# Patient Record
Sex: Female | Born: 1965 | Race: White | Hispanic: No | Marital: Married | Smoking: Former smoker
Health system: Southern US, Community
[De-identification: ages and names within clinical notes are randomized; demographics above are authoritative.]

## PROBLEM LIST (undated history)

## (undated) DIAGNOSIS — R896 Abnormal cytological findings in specimens from other organs, systems and tissues: Secondary | ICD-10-CM

## (undated) DIAGNOSIS — B977 Papillomavirus as the cause of diseases classified elsewhere: Secondary | ICD-10-CM

## (undated) DIAGNOSIS — G43909 Migraine, unspecified, not intractable, without status migrainosus: Secondary | ICD-10-CM

## (undated) DIAGNOSIS — IMO0001 Reserved for inherently not codable concepts without codable children: Secondary | ICD-10-CM

## (undated) HISTORY — DX: Reserved for inherently not codable concepts without codable children: IMO0001

## (undated) HISTORY — DX: Migraine, unspecified, not intractable, without status migrainosus: G43.909

## (undated) HISTORY — DX: Abnormal cytological findings in specimens from other organs, systems and tissues: R89.6

## (undated) HISTORY — DX: Papillomavirus as the cause of diseases classified elsewhere: B97.7

---

## 2001-08-01 ENCOUNTER — Encounter: Payer: Self-pay | Admitting: Obstetrics and Gynecology

## 2001-08-01 ENCOUNTER — Encounter: Admission: RE | Admit: 2001-08-01 | Discharge: 2001-08-01 | Payer: Self-pay | Admitting: Obstetrics and Gynecology

## 2002-10-20 ENCOUNTER — Other Ambulatory Visit: Admission: RE | Admit: 2002-10-20 | Discharge: 2002-10-20 | Payer: Self-pay | Admitting: Obstetrics and Gynecology

## 2003-09-17 ENCOUNTER — Other Ambulatory Visit: Admission: RE | Admit: 2003-09-17 | Discharge: 2003-09-17 | Payer: Self-pay | Admitting: Obstetrics and Gynecology

## 2004-07-14 ENCOUNTER — Other Ambulatory Visit: Admission: RE | Admit: 2004-07-14 | Discharge: 2004-07-14 | Payer: Self-pay | Admitting: Obstetrics and Gynecology

## 2005-07-10 ENCOUNTER — Other Ambulatory Visit: Admission: RE | Admit: 2005-07-10 | Discharge: 2005-07-10 | Payer: Self-pay | Admitting: Obstetrics and Gynecology

## 2008-07-19 ENCOUNTER — Encounter: Admission: RE | Admit: 2008-07-19 | Discharge: 2008-08-29 | Payer: Self-pay | Admitting: Family Medicine

## 2011-06-15 ENCOUNTER — Emergency Department (HOSPITAL_COMMUNITY): Payer: 59

## 2011-06-15 ENCOUNTER — Encounter (HOSPITAL_COMMUNITY): Payer: Self-pay | Admitting: Emergency Medicine

## 2011-06-15 ENCOUNTER — Ambulatory Visit (HOSPITAL_COMMUNITY)
Admission: EM | Admit: 2011-06-15 | Discharge: 2011-06-16 | Disposition: A | Discharge status: Home / Self Care | Payer: 59 | Attending: Emergency Medicine | Admitting: Emergency Medicine

## 2011-06-15 DIAGNOSIS — K3580 Unspecified acute appendicitis: Secondary | ICD-10-CM | POA: Diagnosis present

## 2011-06-15 DIAGNOSIS — G43909 Migraine, unspecified, not intractable, without status migrainosus: Secondary | ICD-10-CM | POA: Diagnosis present

## 2011-06-15 DIAGNOSIS — Z79899 Other long term (current) drug therapy: Secondary | ICD-10-CM | POA: Insufficient documentation

## 2011-06-15 MED ORDER — SODIUM CHLORIDE 0.9 % IV SOLN
Freq: Once | INTRAVENOUS | Status: AC
  Administered 2011-06-16: via INTRAVENOUS

## 2011-06-15 MED ORDER — HYDROMORPHONE HCL PF 1 MG/ML IJ SOLN
1.0000 mg | Freq: Once | INTRAMUSCULAR | Status: AC
  Administered 2011-06-16: 1 mg via INTRAVENOUS
  Filled 2011-06-15: qty 1

## 2011-06-15 MED ORDER — ONDANSETRON HCL 4 MG/2ML IJ SOLN
4.0000 mg | Freq: Once | INTRAMUSCULAR | Status: AC
  Administered 2011-06-16: 4 mg via INTRAVENOUS
  Filled 2011-06-15: qty 2

## 2011-06-15 NOTE — ED Notes (Signed)
Pt alert, c/o right lower quad pain, onset this am, seen in [facility], instructed to f/u for U/S of gallbladder, provided pain relief, c/o nausea, and emesis, resp even unlabored, skin pwd

## 2011-06-16 ENCOUNTER — Encounter (HOSPITAL_COMMUNITY): Payer: Self-pay | Admitting: Anesthesiology

## 2011-06-16 ENCOUNTER — Encounter (HOSPITAL_COMMUNITY)
Admission: EM | Disposition: A | Discharge status: Home / Self Care | Payer: Self-pay | Source: Home / Self Care | Attending: Emergency Medicine

## 2011-06-16 ENCOUNTER — Emergency Department (HOSPITAL_COMMUNITY): Payer: 59 | Admitting: Anesthesiology

## 2011-06-16 ENCOUNTER — Encounter (HOSPITAL_COMMUNITY): Payer: Self-pay

## 2011-06-16 DIAGNOSIS — G43909 Migraine, unspecified, not intractable, without status migrainosus: Secondary | ICD-10-CM | POA: Diagnosis present

## 2011-06-16 DIAGNOSIS — K3580 Unspecified acute appendicitis: Secondary | ICD-10-CM | POA: Diagnosis present

## 2011-06-16 HISTORY — PX: LAPAROSCOPIC APPENDECTOMY: SHX408

## 2011-06-16 LAB — CBC
HCT: 41.6 % (ref 36.0–46.0)
Hemoglobin: 14.5 g/dL (ref 12.0–15.0)
MCH: 32.9 pg (ref 26.0–34.0)
MCHC: 34.9 g/dL (ref 30.0–36.0)
MCV: 94.3 fL (ref 78.0–100.0)
Platelets: 263 10*3/uL (ref 150–400)
RBC: 4.41 MIL/uL (ref 3.87–5.11)
RDW: 12.9 % (ref 11.5–15.5)
WBC: 17.6 10*3/uL — ABNORMAL HIGH (ref 4.0–10.5)

## 2011-06-16 LAB — COMPREHENSIVE METABOLIC PANEL
Albumin: 4.1 g/dL (ref 3.5–5.2)
BUN: 16 mg/dL (ref 6–23)
Calcium: 9.1 mg/dL (ref 8.4–10.5)
Creatinine, Ser: 0.98 mg/dL (ref 0.50–1.10)
Total Bilirubin: 0.3 mg/dL (ref 0.3–1.2)
Total Protein: 7.9 g/dL (ref 6.0–8.3)

## 2011-06-16 LAB — POCT PREGNANCY, URINE: Preg Test, Ur: NEGATIVE

## 2011-06-16 SURGERY — APPENDECTOMY, LAPAROSCOPIC
Anesthesia: General | Site: Abdomen | Wound class: Contaminated

## 2011-06-16 MED ORDER — ONDANSETRON HCL 4 MG/2ML IJ SOLN
INTRAMUSCULAR | Status: DC | PRN
  Administered 2011-06-16: 4 mg via INTRAVENOUS

## 2011-06-16 MED ORDER — FENTANYL CITRATE 0.05 MG/ML IJ SOLN: 25.0000 ug | INTRAMUSCULAR | Status: DC | PRN

## 2011-06-16 MED ORDER — HYDROMORPHONE HCL PF 1 MG/ML IJ SOLN
1.0000 mg | Freq: Once | INTRAMUSCULAR | Status: AC
  Administered 2011-06-16: 1 mg via INTRAVENOUS
  Filled 2011-06-16: qty 1

## 2011-06-16 MED ORDER — ACETAMINOPHEN 325 MG PO TABS: 650.0000 mg | ORAL_TABLET | Freq: Four times a day (QID) | ORAL | Status: AC | PRN

## 2011-06-16 MED ORDER — AMOXICILLIN-POT CLAVULANATE 875-125 MG PO TABS: 1.0000 | ORAL_TABLET | Freq: Two times a day (BID) | ORAL | Status: AC

## 2011-06-16 MED ORDER — GLYCOPYRROLATE 0.2 MG/ML IJ SOLN
INTRAMUSCULAR | Status: DC | PRN
  Administered 2011-06-16: .5 mg via INTRAVENOUS

## 2011-06-16 MED ORDER — CISATRACURIUM BESYLATE (PF) 10 MG/5ML IV SOLN
INTRAVENOUS | Status: DC | PRN
  Administered 2011-06-16: 6 mg via INTRAVENOUS

## 2011-06-16 MED ORDER — DEXAMETHASONE SODIUM PHOSPHATE 10 MG/ML IJ SOLN
INTRAMUSCULAR | Status: DC | PRN
  Administered 2011-06-16: 10 mg via INTRAVENOUS

## 2011-06-16 MED ORDER — OXYCODONE HCL 5 MG PO TABS: 5.0000 mg | ORAL_TABLET | ORAL | Status: DC | PRN

## 2011-06-16 MED ORDER — 0.9 % SODIUM CHLORIDE (POUR BTL) OPTIME
TOPICAL | Status: DC | PRN
  Administered 2011-06-16: 100 mL

## 2011-06-16 MED ORDER — BUPIVACAINE HCL (PF) 0.5 % IJ SOLN
INTRAMUSCULAR | Status: AC
  Filled 2011-06-16: qty 30

## 2011-06-16 MED ORDER — SODIUM CHLORIDE 0.9 % IV SOLN
INTRAVENOUS | Status: DC
  Administered 2011-06-16: 15:00:00 via INTRAVENOUS

## 2011-06-16 MED ORDER — PIPERACILLIN-TAZOBACTAM 3.375 G IVPB
3.3750 g | Freq: Three times a day (TID) | INTRAVENOUS | Status: DC
  Administered 2011-06-16: 3.375 g via INTRAVENOUS
  Filled 2011-06-16 (×2): qty 50

## 2011-06-16 MED ORDER — ACETAMINOPHEN 650 MG RE SUPP: 650.0000 mg | Freq: Four times a day (QID) | RECTAL | Status: DC | PRN

## 2011-06-16 MED ORDER — MIDAZOLAM HCL 5 MG/5ML IJ SOLN
INTRAMUSCULAR | Status: DC | PRN
  Administered 2011-06-16: 2 mg via INTRAVENOUS

## 2011-06-16 MED ORDER — IMIPRAMINE HCL 50 MG PO TABS
50.0000 mg | ORAL_TABLET | Freq: Every day | ORAL | Status: DC
  Filled 2011-06-16: qty 1

## 2011-06-16 MED ORDER — LACTATED RINGERS IR SOLN
Status: DC | PRN
  Administered 2011-06-16: 1000 mL

## 2011-06-16 MED ORDER — LACTATED RINGERS IV SOLN: INTRAVENOUS | Status: DC

## 2011-06-16 MED ORDER — NEOSTIGMINE METHYLSULFATE 1 MG/ML IJ SOLN
INTRAMUSCULAR | Status: DC | PRN
  Administered 2011-06-16: 4 mg via INTRAVENOUS

## 2011-06-16 MED ORDER — FENTANYL CITRATE 0.05 MG/ML IJ SOLN
INTRAMUSCULAR | Status: DC | PRN
  Administered 2011-06-16: 100 ug via INTRAVENOUS
  Administered 2011-06-16: 50 ug via INTRAVENOUS

## 2011-06-16 MED ORDER — MORPHINE SULFATE 2 MG/ML IJ SOLN: 2.0000 mg | INTRAMUSCULAR | Status: DC | PRN

## 2011-06-16 MED ORDER — ACETAMINOPHEN 325 MG PO TABS
650.0000 mg | ORAL_TABLET | Freq: Four times a day (QID) | ORAL | Status: DC | PRN
  Administered 2011-06-16: 650 mg via ORAL
  Filled 2011-06-16: qty 2

## 2011-06-16 MED ORDER — OXYCODONE-ACETAMINOPHEN 5-325 MG PO TABS: 1.0000 | ORAL_TABLET | ORAL | Status: AC | PRN

## 2011-06-16 MED ORDER — PROPOFOL 10 MG/ML IV EMUL
INTRAVENOUS | Status: DC | PRN
  Administered 2011-06-16: 160 mg via INTRAVENOUS

## 2011-06-16 MED ORDER — ONDANSETRON HCL 4 MG/2ML IJ SOLN: 4.0000 mg | Freq: Four times a day (QID) | INTRAMUSCULAR | Status: DC | PRN

## 2011-06-16 MED ORDER — SODIUM CHLORIDE 0.9 % IV SOLN
3.0000 g | Freq: Once | INTRAVENOUS | Status: AC
  Administered 2011-06-16: 3 g via INTRAVENOUS
  Filled 2011-06-16: qty 3

## 2011-06-16 MED ORDER — LIDOCAINE-EPINEPHRINE 1 %-1:100000 IJ SOLN
INTRAMUSCULAR | Status: AC
  Filled 2011-06-16: qty 1

## 2011-06-16 MED ORDER — LACTATED RINGERS IV SOLN
INTRAVENOUS | Status: DC | PRN
  Administered 2011-06-16 (×2): via INTRAVENOUS

## 2011-06-16 MED ORDER — BUPIVACAINE HCL (PF) 0.25 % IJ SOLN
INTRAMUSCULAR | Status: DC | PRN
  Administered 2011-06-16: 20 mL

## 2011-06-16 MED ORDER — TOPIRAMATE 100 MG PO TABS
200.0000 mg | ORAL_TABLET | Freq: Every day | ORAL | Status: DC
  Administered 2011-06-16: 200 mg via ORAL
  Filled 2011-06-16: qty 2

## 2011-06-16 MED ORDER — SUCCINYLCHOLINE CHLORIDE 20 MG/ML IJ SOLN
INTRAMUSCULAR | Status: DC | PRN
  Administered 2011-06-16: 100 mg via INTRAVENOUS

## 2011-06-16 MED ORDER — IOHEXOL 300 MG/ML  SOLN
100.0000 mL | Freq: Once | INTRAMUSCULAR | Status: AC | PRN
  Administered 2011-06-16: 100 mL via INTRAVENOUS

## 2011-06-16 SURGICAL SUPPLY — 44 items
ADH SKN CLS APL DERMABOND .7 (GAUZE/BANDAGES/DRESSINGS) ×1
BAG SPEC RTRVL LRG 6X4 10 (ENDOMECHANICALS) ×1
CABLE HIGH FREQUENCY MONO STRZ (ELECTRODE) ×2 IMPLANT
CANISTER SUCTION 2500CC (MISCELLANEOUS) ×2 IMPLANT
CHLORAPREP W/TINT 26ML (MISCELLANEOUS) ×2 IMPLANT
CLOTH BEACON ORANGE TIMEOUT ST (SAFETY) ×2 IMPLANT
CORD HIGH FREQUENCY UNIPOLAR (ELECTROSURGICAL) ×1 IMPLANT
COVER SURGICAL LIGHT HANDLE (MISCELLANEOUS) ×2 IMPLANT
CUTTER FLEX LINEAR 45M (STAPLE) ×2 IMPLANT
DECANTER SPIKE VIAL GLASS SM (MISCELLANEOUS) ×2 IMPLANT
DERMABOND ADVANCED (GAUZE/BANDAGES/DRESSINGS) ×1
DERMABOND ADVANCED .7 DNX12 (GAUZE/BANDAGES/DRESSINGS) ×1 IMPLANT
DRAPE LAPAROSCOPIC ABDOMINAL (DRAPES) ×2 IMPLANT
ELECT REM PT RETURN 9FT ADLT (ELECTROSURGICAL) ×2
ELECTRODE REM PT RTRN 9FT ADLT (ELECTROSURGICAL) ×1 IMPLANT
GLOVE BIO SURGEON STRL SZ7 (GLOVE) ×4 IMPLANT
GLOVE BIOGEL PI IND STRL 7.0 (GLOVE) ×1 IMPLANT
GLOVE BIOGEL PI IND STRL 7.5 (GLOVE) ×2 IMPLANT
GLOVE BIOGEL PI INDICATOR 7.0 (GLOVE)
GLOVE BIOGEL PI INDICATOR 7.5 (GLOVE) ×2
GOWN PREVENTION PLUS LG XLONG (DISPOSABLE) ×2 IMPLANT
GOWN PREVENTION PLUS XLARGE (GOWN DISPOSABLE) ×2 IMPLANT
GOWN STRL NON-REIN LRG LVL3 (GOWN DISPOSABLE) ×2 IMPLANT
GOWN STRL REIN XL XLG (GOWN DISPOSABLE) ×2 IMPLANT
IV LACTATED RINGERS 1000ML (IV SOLUTION) ×1 IMPLANT
KIT BASIN OR (CUSTOM PROCEDURE TRAY) ×2 IMPLANT
NS IRRIG 1000ML POUR BTL (IV SOLUTION) ×2 IMPLANT
PENCIL BUTTON HOLSTER BLD 10FT (ELECTRODE) IMPLANT
POUCH SPECIMEN RETRIEVAL 10MM (ENDOMECHANICALS) ×2 IMPLANT
RELOAD 45 VASCULAR/THIN (ENDOMECHANICALS) IMPLANT
RELOAD STAPLE 45 2.5 WHT GRN (ENDOMECHANICALS) IMPLANT
RELOAD STAPLE 45 3.5 BLU ETS (ENDOMECHANICALS) ×1 IMPLANT
RELOAD STAPLE TA45 3.5 REG BLU (ENDOMECHANICALS) ×2 IMPLANT
SCALPEL HARMONIC ACE (MISCELLANEOUS) ×2 IMPLANT
SET IRRIG TUBING LAPAROSCOPIC (IRRIGATION / IRRIGATOR) ×2 IMPLANT
SOLUTION ANTI FOG 6CC (MISCELLANEOUS) ×2 IMPLANT
SUT MNCRL AB 4-0 PS2 18 (SUTURE) ×2 IMPLANT
SUT VICRYL 0 ENDOLOOP (SUTURE) IMPLANT
TOWEL OR 17X26 10 PK STRL BLUE (TOWEL DISPOSABLE) ×2 IMPLANT
TRAY FOLEY CATH 14FRSI W/METER (CATHETERS) ×2 IMPLANT
TRAY LAP CHOLE (CUSTOM PROCEDURE TRAY) ×2 IMPLANT
TROCAR BLADELESS OPT 5 75 (ENDOMECHANICALS) ×4 IMPLANT
TROCAR XCEL BLUNT TIP 100MML (ENDOMECHANICALS) ×2 IMPLANT
TUBING INSUFFLATION 10FT LAP (TUBING) ×2 IMPLANT

## 2011-06-16 NOTE — H&P (Signed)
Laurie Cortez is an 46 y.o. female.   Chief Complaint: abdominal pain, referred by Dr. Eber Hong HPI:  74 yof who is otherwise healthy began having periumbilical abdominal pain yesterday at noon.  This has progressed and she went to urgent care last night.  It was thought it might be her gallbladder at that time and she was given pain meds and antinausea medications.  The pain then continued to worsen and moved to the RLQ.  There are no relieving factors, aggravated by movement and palpation.  She was also having some n/v.  History reviewed. No pertinent past medical history. PMH: migraines  History reviewed. No pertinent past surgical history.  No family history on file. Social History:  reports that she has never smoked. She does not have any smokeless tobacco history on file. She reports that she does not drink alcohol. Her drug history not on file.  Allergies: No Known Allergies  Meds: imipramine, topamax  Results for orders placed during the hospital encounter of 06/15/11 (from the past 48 hour(s))  CBC     Status: Abnormal   Collection Time   06/16/11 12:01 AM      Component Value Range Comment   WBC 17.6 (*) 4.0 - 10.5 (K/uL)    RBC 4.41  3.87 - 5.11 (MIL/uL)    Hemoglobin 14.5  12.0 - 15.0 (g/dL)    HCT 16.1  09.6 - 04.5 (%)    MCV 94.3  78.0 - 100.0 (fL)    MCH 32.9  26.0 - 34.0 (pg)    MCHC 34.9  30.0 - 36.0 (g/dL)    RDW 40.9  81.1 - 91.4 (%)    Platelets 263  150 - 400 (K/uL)   COMPREHENSIVE METABOLIC PANEL     Status: Abnormal   Collection Time   06/16/11 12:01 AM      Component Value Range Comment   Sodium 131 (*) 135 - 145 (mEq/L)    Potassium 4.1  3.5 - 5.1 (mEq/L)    Chloride 99  96 - 112 (mEq/L)    CO2 21  19 - 32 (mEq/L)    Glucose, Bld 125 (*) 70 - 99 (mg/dL)    BUN 16  6 - 23 (mg/dL)    Creatinine, Ser 7.82  0.50 - 1.10 (mg/dL)    Calcium 9.1  8.4 - 10.5 (mg/dL)    Total Protein 7.9  6.0 - 8.3 (g/dL)    Albumin 4.1  3.5 - 5.2 (g/dL)    AST 21  0 - 37  (U/L)    ALT 18  0 - 35 (U/L)    Alkaline Phosphatase 68  39 - 117 (U/L)    Total Bilirubin 0.3  0.3 - 1.2 (mg/dL)    GFR calc non Af Amer 68 (*) >90 (mL/min)    GFR calc Af Amer 79 (*) >90 (mL/min)   POCT PREGNANCY, URINE     Status: Normal   Collection Time   06/16/11  1:28 AM      Component Value Range Comment   Preg Test, Ur NEGATIVE  NEGATIVE     Ct Abdomen Pelvis W Contrast  06/16/2011  *RADIOLOGY REPORT*  Clinical Data: Right lower quadrant pain.  Nausea and vomiting. White cell count 17.6.  CT ABDOMEN AND PELVIS WITH CONTRAST  Technique:  Multidetector CT imaging of the abdomen and pelvis was performed following the standard protocol during bolus administration of intravenous contrast.  Contrast: OMNIPAQUE IOHEXOL 300 MG/ML  SOLN  Comparison: None.  Findings: Mild dependent changes in the lung bases.    Calcified granulomas in the liver and spleen.  Calcification in the hepatic hilum adjacent to the gallbladder may represent a gallstone or bile duct stone.  No gallbladder distension or wall thickening.  No biliary dilatation.  The pancreas, adrenal glands, abdominal aorta, and retroperitoneal lymph nodes are unremarkable.  Low attenuation lesion in the upper pole of the left kidney measures 1.8 cm and likely represents a cyst.  No solid mass or hydronephrosis in either kidney.  The stomach, small bowel, and colon are not abnormally distended.  No free air or free fluid in the abdomen.  Pelvis:  There is a large appendicolith at the base of the appendix measuring 9 mm diameter. Additional appendicolith at the appendiceal tip.  The appendix is prominently distended with maximal diameter of 1.7 cm.  There is periappendiceal stranding. Changes are consistent with acute appendicitis.  No abscess.  Small amount of free fluid in the pelvis.  The uterus and adnexal structures are not enlarged.  The bladder wall is not thickened.  No significant pelvic lymphadenopathy. Mild degenerative changes in  the lumbar spine.  IMPRESSION: Changes of acute appendicitis with appendicoliths and periappendiceal stranding.  No abscess.  Small amount of free pelvic fluid.  Calcification in the porta hepatis could represent gallstones or biliary stones versus calcified lymph node.  No bile duct dilatation.  Original Report Authenticated By: Marlon Pel, M.D.    ROS  Blood pressure 105/63, pulse 94, temperature 98 F (36.7 C), resp. rate 16, weight 145 lb (65.772 kg), last menstrual period 05/21/2011, SpO2 97.00%. Physical Exam  Vitals reviewed. Constitutional: She appears well-developed and well-nourished.  Eyes: No scleral icterus.  Cardiovascular: Normal rate, regular rhythm and normal heart sounds.   Respiratory: Effort normal and breath sounds normal. She has no wheezes. She has no rales.  GI: Soft. Normal appearance and bowel sounds are normal. There is tenderness in the right lower quadrant.     Assessment/Plan Acute appendicitis  We discussed pathophysiology of appendicitis.  We discussed no other real options at this time.  I recommended laparoscopic appendectomy.  Risks include but not limited to bleeding, infection, postoperative abscess requiring drainage, open incision, open wound, injury to surrounding structures.  She understands and we will proceed  [facility] 06/16/2011, 4:51 AM

## 2011-06-16 NOTE — Anesthesia Postprocedure Evaluation (Signed)
  Anesthesia Post-op Note  Patient: Laurie Cortez  Procedure(s) Performed: Procedure(s) (LRB): APPENDECTOMY LAPAROSCOPIC (N/A)  Patient Location: PACU  Anesthesia Type: General  Level of Consciousness: awake and alert   Airway and Oxygen Therapy: Patient Spontanous Breathing  Post-op Pain: mild  Post-op Assessment: Post-op Vital signs reviewed, Patient's Cardiovascular Status Stable, Respiratory Function Stable, Patent Airway and No signs of Nausea or vomiting  Post-op Vital Signs: stable  Complications: No apparent anesthesia complications

## 2011-06-16 NOTE — ED Notes (Signed)
46 year old female history of abdominal pain started yesterday and has been persistent and moving to the right lower quadrant, patient denies nausea vomiting but has loss of appetite, no fevers. No abdominal surgical history no other significant medical problems.  Presented initially to outpatient urgent care setting where she was given pain medication and referred to the emergency department should her pain worsen.  Physical exam:  Healthy-appearing female of stated age, no edema of the lower extremities, clear heart and lungs, abdomen which is soft but tender with guarding to the right lower quadrant. Non-peritoneal  Assessment:  Elevated white blood cell count, electrolytes overall normal, normal renal function, ongoing right lower quadrant tenderness which is a confirmed appendicitis on CT scan of the abdomen. Discussed with general surgeon Dr. Dwain Sarna who will see the patient this morning and likely operator the next few hours. Antibiotics requested, Unasyn ordered. Repeat pain medication given.  Medical screening examination/treatment/procedure(s) were conducted as a shared visit with non-physician practitioner(s) and myself.  I personally evaluated the patient during the encounter    Vida Roller, MD 06/16/11 [phone]

## 2011-06-16 NOTE — Op Note (Signed)
Preoperative diagnosis: Acute suppurative appendicitis Postoperative diagnosis: SAA Procedure: laparoscopic appendectomy Surgeon: Dr. Harden Mo Anesthesia: GETA Specimen:appendix to pathology EBL: minimal Complications: none Sponge and needle count correct times two at end of operation Dispo to recovery in stable condition  Indications: This is a 50 yof who has RLQ pain, elevated WBC, and a CT scan consistent with appendicitis.  We discussed a laparoscopic appendectomy with risks and benefits.  Procedure: After informed consent was obtained, the patient was taken to the operating room.  She was administered unasyn.  SCDs were placed on her lower extremities prior to beginning.  She was then placed under general anesthesia without complication.  Her abdomen was then prepped and draped in the standard sterile surgical fashion.  A Foley catheter was placed.  A surgical timeout was then performed.  I infiltrated quarter percent Marcaine below the umbilicus.  I then made a vertical incision below the umbilicus.  I grasped the fascia and entered it sharply.  The peritoneum was entered bluntly.  I then placed a 0 vicryl pursestring suture through the fascia.  I then inserted a Hasson trocar and insufflated the abdomen to 15 mm Hg pressure.  I then inserted two further trocars in the suprapubic region and the LLQ under direct vision without complication.  The appendix was then noted to be acutely suppurative.  The cecum and terminal ileum were both identified and remained intact.  The appendix was grasped and the mesentery was divided with the harmonic scalpel.  The base was identified and was clean.  I then stapled across the base and removed the appendix with an endocatch bag.  There was a small site of bleeding on the staple line that was cauterized.  Irrigation was performed.  I removed some murky fluid from the pelvis.  I evacuated all the fluid.  I then removed the Hasson and tied the pursestring  down. This completely obliterated the defect.  I then desufflated the abdomen and removed the trocars.  These were closed with 4-0 monocryl and dermabond.  She tolerated this well, was extubated in the or and transferred to the recovery room in stable condition.

## 2011-06-16 NOTE — Anesthesia Preprocedure Evaluation (Signed)
Anesthesia Evaluation  Patient identified by MRN, date of birth, ID band Patient awake    Reviewed: Allergy & Precautions, H&P , NPO status , Patient's Chart, lab work & pertinent test results, reviewed documented beta blocker date and time   Airway Mallampati: II TM Distance: >3 FB Neck ROM: full    Dental No notable dental hx. (+) Teeth Intact and Dental Advisory Given   Pulmonary neg pulmonary ROS,  breath sounds clear to auscultation  Pulmonary exam normal       Cardiovascular Exercise Tolerance: Good negative cardio ROS  Rhythm:regular Rate:Normal     Neuro/Psych negative neurological ROS  negative psych ROS   GI/Hepatic negative GI ROS, Neg liver ROS,   Endo/Other  negative endocrine ROS  Renal/GU negative Renal ROS  negative genitourinary   Musculoskeletal   Abdominal   Peds  Hematology negative hematology ROS (+)   Anesthesia Other Findings   Reproductive/Obstetrics negative OB ROS                           Anesthesia Physical Anesthesia Plan  ASA: I and Emergent  Anesthesia Plan: General   Post-op Pain Management:    Induction: Intravenous  Airway Management Planned: Oral ETT  Additional Equipment:   Intra-op Plan:   Post-operative Plan: Extubation in OR  Informed Consent: I have reviewed the patients History and Physical, chart, labs and discussed the procedure including the risks, benefits and alternatives for the proposed anesthesia with the patient or authorized representative who has indicated his/her understanding and acceptance.   Dental Advisory Given  Plan Discussed with: CRNA and Surgeon  Anesthesia Plan Comments:         Anesthesia Quick Evaluation

## 2011-06-16 NOTE — Progress Notes (Signed)
Day of Surgery  Subjective: Sleepy and sore, but doing well.  Wants to go home tonight  Objective: Vital signs in last 24 hours: Temp:  [97.4 F (36.3 C)-98.1 F (36.7 C)] 98.1 F (36.7 C) (05/14 1354) Pulse Rate:  [57-112] 98  (05/14 1354) Resp:  [13-18] 18  (05/14 1354) BP: (100-126)/(62-79) 107/74 mmHg (05/14 1354) SpO2:  [95 %-100 %] 100 % (05/14 1354) Weight:  [65.772 kg (145 lb)-70.563 kg (155 lb 9 oz)] 70.563 kg (155 lb 9 oz) (05/14 0956)    Intake/Output from previous day: 05/13 0701 - 05/14 0700 In: 1000 [I.V.:1000] Out: 525 [Urine:500; Blood:25] Intake/Output this shift: Total I/O In: 320 [P.O.:120; I.V.:200] Out: 600 [Urine:600]  General appearance: alert, cooperative and no distress GI: tender, no distension, few bowel sounds,   Lab Results:   Basename 06/16/11 0001  WBC 17.6*  HGB 14.5  HCT 41.6  PLT 263    BMET  Basename 06/16/11 0001  NA 131*  K 4.1  CL 99  CO2 21  GLUCOSE 125*  BUN 16  CREATININE 0.98  CALCIUM 9.1   PT/INR No results found for this basename: LABPROT:2,INR:2 in the last 72 hours   Lab 06/16/11 0001  AST 21  ALT 18  ALKPHOS 68  BILITOT 0.3  PROT 7.9  ALBUMIN 4.1     Lipase  No results found for this basename: lipase     Studies/Results: Ct Abdomen Pelvis W Contrast  06/16/2011  *RADIOLOGY REPORT*  Clinical Data: Right lower quadrant pain.  Nausea and vomiting. White cell count 17.6.  CT ABDOMEN AND PELVIS WITH CONTRAST  Technique:  Multidetector CT imaging of the abdomen and pelvis was performed following the standard protocol during bolus administration of intravenous contrast.  Contrast: OMNIPAQUE IOHEXOL 300 MG/ML  SOLN  Comparison: None.  Findings: Mild dependent changes in the lung bases.    Calcified granulomas in the liver and spleen.  Calcification in the hepatic hilum adjacent to the gallbladder may represent a gallstone or bile duct stone.  No gallbladder distension or wall thickening.  No biliary  dilatation.  The pancreas, adrenal glands, abdominal aorta, and retroperitoneal lymph nodes are unremarkable.  Low attenuation lesion in the upper pole of the left kidney measures 1.8 cm and likely represents a cyst.  No solid mass or hydronephrosis in either kidney.  The stomach, small bowel, and colon are not abnormally distended.  No free air or free fluid in the abdomen.  Pelvis:  There is a large appendicolith at the base of the appendix measuring 9 mm diameter. Additional appendicolith at the appendiceal tip.  The appendix is prominently distended with maximal diameter of 1.7 cm.  There is periappendiceal stranding. Changes are consistent with acute appendicitis.  No abscess.  Small amount of free fluid in the pelvis.  The uterus and adnexal structures are not enlarged.  The bladder wall is not thickened.  No significant pelvic lymphadenopathy. Mild degenerative changes in the lumbar spine.  IMPRESSION: Changes of acute appendicitis with appendicoliths and periappendiceal stranding.  No abscess.  Small amount of free pelvic fluid.  Calcification in the porta hepatis could represent gallstones or biliary stones versus calcified lymph node.  No bile duct dilatation.  Original Report Authenticated By: Marlon Pel, M.D.    Medications:    . sodium chloride   Intravenous Once  . ampicillin-sulbactam (UNASYN) IV  3 g Intravenous Once  .  HYDROmorphone (DILAUDID) injection  1 mg Intravenous Once  .  HYDROmorphone (  DILAUDID) injection  1 mg Intravenous Once  . imipramine  50 mg Oral QHS  . ondansetron  4 mg Intravenous Once  . piperacillin-tazobactam (ZOSYN)  IV  3.375 g Intravenous Q8H  . topiramate  200 mg Oral Daily    Assessment/Plan Acute suppurative appendicitis s/p  Procedure: laparoscopic appendectomy  Dr. Dwain Sarna 06/16/11 Hx of Migraines  Plan:  If she does well with dinner the nurse has permission to discharge.  Follow up 2 weeks MW.  1 week of antibiotics at home.  I answer all  the questions I could.       LOS: 1 day    Laurie Cortez 06/16/2011

## 2011-06-16 NOTE — Discharge Summary (Signed)
Physician Discharge Summary  Patient ID: Laurie Cortez MRN: [id number] DOB/AGE: [birth year] 46 y.o.  Admit date: 06/15/2011 Discharge date: 06/16/2011  Admission Diagnoses: Acute Appendicitis migraines     Discharge Diagnoses: Acute suppurative appendicitis migraines     Active Problems:  * No active hospital problems. *    PROCEDURES: Procedure: laparoscopic appendectomy  Surgeon: Dr. Harden Mo 06/16/11   Hospital Course: 35 yof who is otherwise healthy began having periumbilical abdominal pain yesterday at noon. This has progressed and she went to urgent care last night. It was thought it might be her gallbladder at that time and she was given pain meds and antinausea medications. The pain then continued to worsen and moved to the RLQ. There are no relieving factors, aggravated by movement and palpation. She was also having some n/v.  She was taken to the OR and completed early AM 06/16/11.  She has done well thru the day.  She had some trouble voiding, her bladder "would not empty."  After walking she's feeling much better.  She would like to go home tonight.  Plan 7 more days of antibiotics.  Follow up in 2 weeks with DR. Rosenbower.  Condition on D/C:  Improved  Disposition: Final discharge disposition not confirmed   Medication List  As of 06/16/2011  4:49 PM   STOP taking these medications         sulfamethoxazole-trimethoprim 800-160 MG per tablet         TAKE these medications         acetaminophen 325 MG tablet   Commonly known as: TYLENOL   Take 2 tablets (650 mg total) by mouth every 6 (six) hours as needed (or Temp > 100).      amoxicillin-clavulanate 875-125 MG per tablet   Commonly known as: AUGMENTIN   Take 1 tablet by mouth 2 (two) times daily.      imipramine 25 MG tablet   Commonly known as: TOFRANIL   Take 50 mg by mouth at bedtime.      oxyCODONE-acetaminophen 5-325 MG per tablet   Commonly known as: PERCOCET   Take 1 tablet by mouth every 4  (four) hours as needed for pain.      SEASONIQUE 0.15-0.03 &0.01 MG tablet   Generic drug: Levonorgestrel-Ethinyl Estradiol   Take 1 tablet by mouth daily.      topiramate 200 MG tablet   Commonly known as: TOPAMAX   Take 200 mg by mouth daily.           Follow-up Information    Follow up with [facility], [place]. Schedule an appointment as soon as possible for a visit in 2 weeks.   Contact information:   [employer] [address] [place] [address] [phone]          Signed: Sherrie George 06/16/2011, 4:49 PM

## 2011-06-16 NOTE — Transfer of Care (Signed)
Immediate Anesthesia Transfer of Care Note  Patient: Laurie Cortez  Procedure(s) Performed: Procedure(s) (LRB): APPENDECTOMY LAPAROSCOPIC (N/A)  Patient Location: PACU  Anesthesia Type: General  Level of Consciousness: awake, alert , oriented and patient cooperative  Airway & Oxygen Therapy: Patient Spontanous Breathing and Patient connected to face mask oxygen  Post-op Assessment: Report given to PACU RN and Post -op Vital signs reviewed and stable  Post vital signs: Reviewed and stable  Complications: No apparent anesthesia complications

## 2011-06-16 NOTE — Progress Notes (Signed)
Discharge instructions and prescriptions given.  No questions asked.  Left via wheelchair with husband. Edison Nasuti

## 2011-06-16 NOTE — Discharge Instructions (Signed)
[facility] LAPAROSCOPIC SURGERY: POST OP INSTRUCTIONS  Always review your discharge instruction sheet given to you by the facility where your surgery was performed. IF YOU HAVE DISABILITY OR FAMILY LEAVE FORMS, YOU MUST BRING THEM TO THE OFFICE FOR PROCESSING.   DO NOT GIVE THEM TO YOUR DOCTOR.  1. A prescription for pain medication may be given to you upon discharge.  Take your pain medication as prescribed, if needed.  If narcotic pain medicine is not needed, then you may take acetaminophen (Tylenol), naprosyn (Alleve), or ibuprofen (Advil) as needed. 2. Take your usually prescribed medications unless otherwise directed. 3. If you need a refill on your pain medication, please contact your pharmacy.  They will contact our office to request authorization. Prescriptions will not be filled after 5pm or on week-ends. 4. You should follow a light diet the first few days after arrival home, such as soup and crackers, etc.  Be sure to include lots of fluids daily. 5. Most patients will experience some swelling and bruising in the area of the incisions.  Ice packs will help.  Swelling and bruising can take several days to resolve.  6. It is common to experience some constipation if taking pain medication after surgery.  Increasing fluid intake and taking a stool softener (such as Colace) will usually help or prevent this problem from occurring.  A mild laxative (Milk of Magnesia or Miralax) should be taken according to package instructions if there are no bowel movements after 48 hours. 7. Unless discharge instructions indicate otherwise, you may remove your bandages 48 hours after surgery, and you may shower at that time.  You may have steri-strips (small skin tapes) in place directly over the incision.  These strips should be left on the skin for 7-10 days.  If your surgeon used skin glue on the incision, you may shower in 24 hours.  The glue will flake  off over the next 2-3 weeks.  Any sutures or staples will be removed at the office during your follow-up visit. 8. ACTIVITIES:  You may resume regular (light) daily activities beginning the next day--such as daily self-care, walking, climbing stairs--gradually increasing activities as tolerated.  You may have sexual intercourse when it is comfortable.  Refrain from any heavy lifting or straining until approved by your doctor. a. You may drive when you are no longer taking prescription pain medication, you can comfortably wear a seatbelt, and you can safely maneuver your car and apply brakes. b. RETURN TO WORK:  __________________________________________________________ 9. You should see your doctor in the office for a follow-up appointment approximately 2-3 weeks after your surgery.  Make sure that you call for this appointment within a day or two after you arrive home to insure a convenient appointment time. 10. OTHER INSTRUCTIONS: __________________________________________________________________________________________________________________________ __________________________________________________________________________________________________________________________ WHEN TO CALL YOUR DOCTOR: 1. Fever over 101.0 2. Inability to urinate 3. Continued bleeding from incision. 4. Increased pain, redness, or drainage from the incision. 5. Increasing abdominal pain  The clinic staff is available to answer your questions during regular business hours.  Please don't hesitate to call and ask to speak to one of the nurses for clinical concerns.  If you have a medical emergency, go to the nearest emergency room or call 911.  A surgeon from [facility] is always on call at the hospital. [address], [address], [place], [place]  [address] ? [address] [address], [place], [place]   [address] [phone] ? [phone] ? FAX [phone]   [phone] Web site: [email or web address] Laparoscopic  Appendectomy Appendectomy is surgery to remove the appendix. Laparoscopic surgery uses several small cuts (incisions) instead of one large incision. Laparoscopic surgery offers a shorter recovery time and less discomfort. LET YOUR CAREGIVER KNOW ABOUT:  Allergies to food or medicine.   Medicines taken, including vitamins, dietary supplements, herbs, eyedrops, over-the-counter medicines, and creams.   Use of steroids (by mouth or creams).   Previous problems with anesthetics or numbing medicines.   History of bleeding problems or blood clots.   Previous surgery.   Other health problems, including diabetes, heart problems, lung problems, and kidney problems.   Possibility of pregnancy, if this applies.  RISKS AND COMPLICATIONS  Infection. A germ starts growing in the wound. This can usually be treated with antibiotics. In some cases, the wound will need to be opened and cleaned.   Bleeding.   Damage to other organs.   Sores (abscesses).   Chronic pain at the incision sites. This is defined as pain that lasts for more than 3 months.   Blood clots in the legs that may rarely travel to the lungs.   Infection in the lungs (pneumonia).  BEFORE THE PROCEDURE Appendectomy is usually performed immediately after an inflamed appendix (appendicitis) is diagnosed. No preparation is necessary ahead of this procedure. PROCEDURE  You will be given medicine that makes you sleep (general anesthetic). After you are asleep, a flexible tube (catheter) may be inserted into your bladder to drain your urine during surgery. The tube is removed before you wake up after surgery. When you are asleep, carbondioxide gas will be used to inflate your abdomen. This will allow your surgeon to see inside your abdomen and perform your surgery. Three small incisions will be made in your abdomen. Your surgeon will insert a thin, lighted tube (laparoscope) through one of the incisions. Your surgeon will look through  the laparoscope while performing the surgery. Other tools will be inserted through the other incisions. Laparoscopic procedures may not be appropriate when:  There is major scarring from a previous surgery.   The patient has bleeding disorders.   A pregnancy is near term.   There are other conditions which make the laparoscopic procedure impossible, such as an advanced infection or a ruptured appendix.  If your surgeon feels it is not safe to continue with the laparoscopic procedure, he or she will perform an open surgery instead. This gives the surgeon a larger view and more space to work. Open surgery requires a longer recovery time. After your appendix is removed, your incisions will be closed with stitches (sutures) or skin adhesive. AFTER THE PROCEDURE You will be taken to a recovery room. When the anesthesia has worn off, you will be returned to your hospital room. You will be given pain medicines to keep you comfortable. Ask your caregiver how long your hospital stay will be. Document Released: 09/03/2003 Document Revised: 01/08/2011 Document Reviewed: 07/29/2010 [facility] Patient Information 2012 [place], [place].

## 2011-06-17 ENCOUNTER — Encounter (HOSPITAL_COMMUNITY): Payer: Self-pay | Admitting: General Surgery

## 2011-06-17 NOTE — ED Provider Notes (Signed)
History     CSN: [id number]  Arrival date & time 06/15/11  2133   First MD Initiated Contact with Patient 06/15/11 2235      Chief Complaint  Patient presents with  . Abdominal Pain    (Consider location/radiation/quality/duration/timing/severity/associated sxs/prior treatment) HPI  Past Medical History  Diagnosis Date  . Headache     migraines    Past Surgical History  Procedure Date  . Appendectomy 06/16/2011    History reviewed. No pertinent family history.  History  Substance Use Topics  . Smoking status: Never Smoker   . Smokeless tobacco: Never Used  . Alcohol Use: No    OB History    Grav Para Term Preterm Abortions TAB SAB Ect Mult Living                  Review of Systems  Allergies  Review of patient's allergies indicates no known allergies.  Home Medications   Current Outpatient Rx  Name Route Sig Dispense Refill  . IMIPRAMINE HCL 25 MG PO TABS Oral Take 50 mg by mouth at bedtime.    Marland Kitchen LEVONORGEST-ETH ESTRAD 91-DAY 0.15-0.03 &0.01 MG PO TABS Oral Take 1 tablet by mouth daily.    . TOPIRAMATE 200 MG PO TABS Oral Take 200 mg by mouth daily.    . ACETAMINOPHEN 325 MG PO TABS Oral Take 2 tablets (650 mg total) by mouth every 6 (six) hours as needed (or Temp > 100).    . AMOXICILLIN-POT CLAVULANATE 875-125 MG PO TABS Oral Take 1 tablet by mouth 2 (two) times daily. 14 tablet 0  . OXYCODONE-ACETAMINOPHEN 5-325 MG PO TABS Oral Take 1 tablet by mouth every 4 (four) hours as needed for pain. 40 tablet 0    BP 107/74  Pulse 98  Temp(Src) 98.1 F (36.7 C) (Oral)  Resp 18  Ht 5\' 4"  (1.626 m)  Wt 155 lb 9 oz (70.563 kg)  BMI 26.70 kg/m2  SpO2 100%  LMP 05/21/2011  Physical Exam  ED Course  Procedures (including critical care time)  Labs Reviewed  CBC - Abnormal; Notable for the following:    WBC 17.6 (*)    All other components within normal limits  COMPREHENSIVE METABOLIC PANEL - Abnormal; Notable for the following:    Sodium 131 (*)    Glucose, Bld 125 (*)    GFR calc non Af Amer 68 (*)    GFR calc Af Amer 79 (*)    All other components within normal limits  POCT PREGNANCY, URINE   Ct Abdomen Pelvis W Contrast  06/16/2011  *RADIOLOGY REPORT*  Clinical Data: Right lower quadrant pain.  Nausea and vomiting. White cell count 17.6.  CT ABDOMEN AND PELVIS WITH CONTRAST  Technique:  Multidetector CT imaging of the abdomen and pelvis was performed following the standard protocol during bolus administration of intravenous contrast.  Contrast: OMNIPAQUE IOHEXOL 300 MG/ML  SOLN  Comparison: None.  Findings: Mild dependent changes in the lung bases.    Calcified granulomas in the liver and spleen.  Calcification in the hepatic hilum adjacent to the gallbladder may represent a gallstone or bile duct stone.  No gallbladder distension or wall thickening.  No biliary dilatation.  The pancreas, adrenal glands, abdominal aorta, and retroperitoneal lymph nodes are unremarkable.  Low attenuation lesion in the upper pole of the left kidney measures 1.8 cm and likely represents a cyst.  No solid mass or hydronephrosis in either kidney.  The stomach, small bowel, and colon are  not abnormally distended.  No free air or free fluid in the abdomen.  Pelvis:  There is a large appendicolith at the base of the appendix measuring 9 mm diameter. Additional appendicolith at the appendiceal tip.  The appendix is prominently distended with maximal diameter of 1.7 cm.  There is periappendiceal stranding. Changes are consistent with acute appendicitis.  No abscess.  Small amount of free fluid in the pelvis.  The uterus and adnexal structures are not enlarged.  The bladder wall is not thickened.  No significant pelvic lymphadenopathy. Mild degenerative changes in the lumbar spine.  IMPRESSION: Changes of acute appendicitis with appendicoliths and periappendiceal stranding.  No abscess.  Small amount of free pelvic fluid.  Calcification in the porta hepatis could  represent gallstones or biliary stones versus calcified lymph node.  No bile duct dilatation.  Original Report Authenticated By: Marlon Pel, M.D.     1. Acute appendicitis       MDM          Arman Filter, NP 06/17/11 [id number]

## 2011-06-17 NOTE — Progress Notes (Signed)
Agree with note. 

## 2011-06-17 NOTE — ED Provider Notes (Signed)
Medical screening examination/treatment/procedure(s) were conducted as a shared visit with non-physician practitioner(s) and myself.  I personally evaluated the patient during the encounter  Please see my separate respective documentation pertaining to this patient encounter   Vida Roller, MD 06/17/11 [phone]

## 2011-06-22 NOTE — Discharge Summary (Signed)
Agree with discharge summary.

## 2011-07-06 ENCOUNTER — Ambulatory Visit (INDEPENDENT_AMBULATORY_CARE_PROVIDER_SITE_OTHER): Payer: 59 | Admitting: General Surgery

## 2011-07-06 ENCOUNTER — Encounter (INDEPENDENT_AMBULATORY_CARE_PROVIDER_SITE_OTHER): Payer: Self-pay | Admitting: General Surgery

## 2011-07-06 VITALS — BP 110/70 | HR 76 | Resp 16 | Ht 64.0 in | Wt 149.0 lb

## 2011-07-06 DIAGNOSIS — Z09 Encounter for follow-up examination after completed treatment for conditions other than malignant neoplasm: Secondary | ICD-10-CM

## 2011-07-06 NOTE — Patient Instructions (Signed)
Return to full activity without restrictions

## 2011-07-06 NOTE — Progress Notes (Signed)
Subjective:     Patient ID: Laurie Cortez, female   DOB: 02-05-1965, 46 y.o.   MRN: [id number]  HPI 47 yof s/p lap appy for appendicitis in mid May.  She is doing well without any complaints today.  Her pathology is consistent with appendicitis.  She states she may have a uti but is due to see her PCP later today.  Review of Systems     Objective:   Physical Exam Well healed incisions without infection    Assessment:     S/p lap appy    Plan:     May return to full activity and see me as needed

## 2011-10-08 ENCOUNTER — Telehealth: Payer: Self-pay

## 2011-10-08 NOTE — Telephone Encounter (Signed)
Tc from pt. C/o irregular menses x 2-3 months while using Seasonique. Pt denies missing any pills. No abn abd pain or fever. Pt also request hormonal testing to r/o peri-menopause. Appt sched 11/04/11@11 :15 with vph for evaluation. Pt voices understanding.

## 2011-11-04 ENCOUNTER — Other Ambulatory Visit: Payer: Self-pay

## 2011-11-04 ENCOUNTER — Encounter: Payer: Self-pay | Admitting: Obstetrics and Gynecology

## 2011-11-04 ENCOUNTER — Ambulatory Visit (INDEPENDENT_AMBULATORY_CARE_PROVIDER_SITE_OTHER): Payer: 59 | Admitting: Obstetrics and Gynecology

## 2011-11-04 VITALS — BP 104/64 | Ht 64.25 in | Wt 148.0 lb

## 2011-11-04 DIAGNOSIS — R635 Abnormal weight gain: Secondary | ICD-10-CM

## 2011-11-04 DIAGNOSIS — N926 Irregular menstruation, unspecified: Secondary | ICD-10-CM | POA: Insufficient documentation

## 2011-11-04 DIAGNOSIS — B977 Papillomavirus as the cause of diseases classified elsewhere: Secondary | ICD-10-CM | POA: Insufficient documentation

## 2011-11-04 MED ORDER — LEVONORGESTREL-ETHINYL ESTRAD 90-20 MCG PO TABS: 1.0000 | ORAL_TABLET | Freq: Every day | ORAL | Status: AC

## 2011-11-04 MED ORDER — NITROFURANTOIN MACROCRYSTAL 50 MG PO CAPS: ORAL_CAPSULE | ORAL | Status: AC

## 2011-11-04 NOTE — Progress Notes (Signed)
GYN PROBLEM VISIT  Subjective: Laurie Cortez is a 46 y.o. year old female,No obstetric history on file., who presents for a problem visit.   1)BTB on Seasonique, lighter but longer than nl, all about 7 days.  Has had only one cycle during each three month period, but not at time expected according to [facility], not at any consistent time with relationship to the pill, therefore unpredictable. When did bleeding start: 05/2011 How  Long: 4 months How often changing pad/tampon: 1 daily Bleeding Disorders: no Cramping: no Contraception: yes Fibroids: no Hormone Therapy: no New Medications: no Menopausal Symptoms: no Vag. Discharge: no Abdominal Pain: no Increased Stress: no Menstrual cycle associated migraines are less frequent.  2)Pt also c/o having frequent uti.  Has had 5 episodes in the last year.  Most are associated with intercourse.   States that she had uti last week and just finished abx 2 days ago. Has been scheduled for urology consult by PCP.  Would like alternative to that consultation at this time.  3)Also states that she is having problems with weight gain since beginning Seasonique.  Actually she had gained 13 lbs the year prior to starting BCPs, and only 3 lbs this year.  Her father had thyroid cancer and she is concerned about thyroid function.  4)States that on Monday she broke out with Hives covering the torso.  Unsure of what caused this. Now resolving  Objective: BP 104/64  Ht 5' 4.25" (1.632 m)  Wt 148 lb (67.132 kg)  BMI 25.21 kg/m2  LMP 10/05/2011 Thyroid:  Nl sized and nontender  Vulva and vagina appear normal. Bimanual exam reveals normal uterus and adnexa. Exam limited by body habitus  Assessment: 1) Irregular menses with extended cycle BCPs but on an unpredictable schedule.  Migraines are improved and this is pt's main concern 2)Recurrent UTIs, ?postcoital 3) Family hx thyroid cancer 4) Improved hives, ?etiology  Plan: 1)  Change BCPs to Lybrel for  consistent low dose hormone that may benefit menstrual migraines.  Cannot predict what menses will be like 2)  Begin Macrodantin post coital.  RTO 2 Wks for urine C&S. 3)  Obtain TSH and vit D at 2 wk lab visit  Return to office in 3 month(s) for f/u.   Dierdre Forth, MD  11/04/2011 11:30 AM

## 2011-11-04 NOTE — Patient Instructions (Signed)
Urinary Tract Infection Urinary tract infections (UTIs) can develop anywhere along your urinary tract. Your urinary tract is your body's drainage system for removing wastes and extra water. Your urinary tract includes two kidneys, two ureters, a bladder, and a urethra. Your kidneys are a pair of bean-shaped organs. Each kidney is about the size of your fist. They are located below your ribs, one on each side of your spine. CAUSES Infections are caused by microbes, which are microscopic organisms, including fungi, viruses, and bacteria. These organisms are so small that they can only be seen through a microscope. Bacteria are the microbes that most commonly cause UTIs. SYMPTOMS  Symptoms of UTIs may vary by age and gender of the patient and by the location of the infection. Symptoms in young women typically include a frequent and intense urge to urinate and a painful, burning feeling in the bladder or urethra during urination. Older women and men are more likely to be tired, shaky, and weak and have muscle aches and abdominal pain. A fever may mean the infection is in your kidneys. Other symptoms of a kidney infection include pain in your back or sides below the ribs, nausea, and vomiting. DIAGNOSIS To diagnose a UTI, your caregiver will ask you about your symptoms. Your caregiver also will ask to provide a urine sample. The urine sample will be tested for bacteria and white blood cells. White blood cells are made by your body to help fight infection. TREATMENT  Typically, UTIs can be treated with medication. Because most UTIs are caused by a bacterial infection, they usually can be treated with the use of antibiotics. The choice of antibiotic and length of treatment depend on your symptoms and the type of bacteria causing your infection. HOME CARE INSTRUCTIONS  If you were prescribed antibiotics, take them exactly as your caregiver instructs you. Finish the medication even if you feel better after you  have only taken some of the medication.  Drink enough water and fluids to keep your urine clear or pale yellow.  Avoid caffeine, tea, and carbonated beverages. They tend to irritate your bladder.  Empty your bladder often. Avoid holding urine for long periods of time.  Empty your bladder before and after sexual intercourse.  After a bowel movement, women should cleanse from front to back. Use each tissue only once. SEEK MEDICAL CARE IF:   You have back pain.  You develop a fever.  Your symptoms do not begin to resolve within 3 days. SEEK IMMEDIATE MEDICAL CARE IF:   You have severe back pain or lower abdominal pain.  You develop chills.  You have nausea or vomiting.  You have continued burning or discomfort with urination. MAKE SURE YOU:   Understand these instructions.  Will watch your condition.  Will get help right away if you are not doing well or get worse. Document Released: 10/29/2004 Document Revised: 07/21/2011 Document Reviewed: 02/27/2011 [employer] Patient Information 2013 [employer].  

## 2011-11-18 ENCOUNTER — Other Ambulatory Visit: Payer: 59

## 2011-11-18 DIAGNOSIS — Z87440 Personal history of urinary (tract) infections: Secondary | ICD-10-CM

## 2011-11-18 DIAGNOSIS — R635 Abnormal weight gain: Secondary | ICD-10-CM

## 2011-11-20 LAB — URINE CULTURE
Colony Count: NO GROWTH
Organism ID, Bacteria: NO GROWTH

## 2011-12-28 ENCOUNTER — Telehealth: Payer: Self-pay | Admitting: Obstetrics and Gynecology

## 2011-12-28 NOTE — Telephone Encounter (Signed)
LVM to advise pt that she did not need to wait a week before starting new pack. Laurie Cortez

## 2012-10-20 ENCOUNTER — Other Ambulatory Visit: Payer: Self-pay | Admitting: Gastroenterology

## 2012-10-31 ENCOUNTER — Other Ambulatory Visit: Payer: Self-pay | Admitting: Gastroenterology

## 2012-10-31 DIAGNOSIS — K562 Volvulus: Secondary | ICD-10-CM

## 2012-10-31 DIAGNOSIS — Q438 Other specified congenital malformations of intestine: Secondary | ICD-10-CM

## 2012-11-24 ENCOUNTER — Other Ambulatory Visit: Payer: 59

## 2012-12-02 ENCOUNTER — Other Ambulatory Visit: Payer: 59

## 2012-12-09 ENCOUNTER — Ambulatory Visit
Admission: RE | Admit: 2012-12-09 | Discharge: 2012-12-09 | Disposition: A | Discharge status: Home / Self Care | Payer: 59 | Source: Ambulatory Visit | Attending: Gastroenterology | Admitting: Gastroenterology

## 2012-12-09 DIAGNOSIS — Q438 Other specified congenital malformations of intestine: Secondary | ICD-10-CM

## 2017-07-14 ENCOUNTER — Encounter (HOSPITAL_COMMUNITY): Payer: Self-pay | Admitting: Emergency Medicine

## 2017-07-14 ENCOUNTER — Emergency Department (HOSPITAL_COMMUNITY)
Admission: EM | Admit: 2017-07-14 | Discharge: 2017-07-14 | Disposition: A | Discharge status: Home / Self Care | Payer: Managed Care, Other (non HMO) | Attending: Emergency Medicine | Admitting: Emergency Medicine

## 2017-07-14 DIAGNOSIS — Z79899 Other long term (current) drug therapy: Secondary | ICD-10-CM | POA: Insufficient documentation

## 2017-07-14 DIAGNOSIS — Z87891 Personal history of nicotine dependence: Secondary | ICD-10-CM | POA: Insufficient documentation

## 2017-07-14 DIAGNOSIS — Z23 Encounter for immunization: Secondary | ICD-10-CM | POA: Diagnosis present

## 2017-07-14 LAB — POC URINE PREG, ED: Preg Test, Ur: NEGATIVE

## 2017-07-14 MED ORDER — RABIES VACCINE, PCEC IM SUSR
1.0000 mL | Freq: Once | INTRAMUSCULAR | Status: AC
  Administered 2017-07-14: 1 mL via INTRAMUSCULAR
  Filled 2017-07-14: qty 1

## 2017-07-14 MED ORDER — TETANUS-DIPHTH-ACELL PERTUSSIS 5-2.5-18.5 LF-MCG/0.5 IM SUSP
0.5000 mL | Freq: Once | INTRAMUSCULAR | Status: AC
  Administered 2017-07-14: 0.5 mL via INTRAMUSCULAR
  Filled 2017-07-14: qty 0.5

## 2017-07-14 MED ORDER — RABIES IMMUNE GLOBULIN 150 UNIT/ML IM INJ
20.0000 [IU]/kg | INJECTION | Freq: Once | INTRAMUSCULAR | Status: AC
  Administered 2017-07-14: 1275 [IU] via INTRAMUSCULAR
  Filled 2017-07-14: qty 8.5

## 2017-07-14 NOTE — ED Triage Notes (Signed)
Pt states that on Monday her dogs killed a raccoon. Reports that [facility] called her today stating that racoon was positive for rabies and since she might have come in contact with saliva of racoon on her dogs she needed to be treated.  Her dogs rabies shots were up to date and got their booster shot Monday at vet.

## 2017-07-14 NOTE — ED Provider Notes (Signed)
[facility]-EMERGENCY DEPT Provider Note   CSN: [id number] Arrival date & time: 07/14/17  1706     History   Chief Complaint Chief Complaint  Patient presents with  . rabies exposure    HPI [name] is a 52 y.o. female with a hx of migraines who presents to the ED for rabies prophylaxis today. She states that her dogs got into an altercation and subsequently killed a raccoon 2 days ago. She states after the incident she was cleaning the dogs up and coming in contact with their saliva, and potentially the racoon's saliva. Animal control was contacted and the racoon has tested positive for rabies. Patient was called by the health department today and instructed to come to the ER for initiation of rabies vaccine series. She has not been previously vaccinated. She has not had any symptoms or concerns. No alleviating/aggravating factors. Denies fever, chills, numbess, weakness, paresthesias, chest pain, or dyspnea. She herself was not bit or scratched.   HPI  Past Medical History:  Diagnosis Date  . ASCUS (atypical squamous cells of undetermined significance) on Pap smear   . HPV (human papilloma virus) infection   . Migraine     Patient Active Problem List   Diagnosis Date Noted  . Irregular menses 11/04/2011  . ASCUS (atypical squamous cells of undetermined significance) on Pap smear   . HPV (human papilloma virus) infection   . Migraines 06/16/2011    Past Surgical History:  Procedure Laterality Date  . LAPAROSCOPIC APPENDECTOMY  06/16/2011   Procedure: APPENDECTOMY LAPAROSCOPIC;  Surgeon: Emelia LoronMatthew Wakefield, MD;  Location: [facility];  Service: General;  Laterality: N/A;     OB History   None      Home Medications    Prior to Admission medications   Medication Sig Start Date End Date Taking? Authorizing Provider  Cholecalciferol (VITAMIN D PO) Take 500 mg by mouth daily.    [provider]  imipramine (TOFRANIL) 25 MG tablet Take 50 mg by  mouth at bedtime.    [provider]  levonorgestrel-ethinyl estradiol (LYBREL,AMETHYST) 90-20 MCG tablet Take 1 tablet by mouth daily. 11/04/11   Silverio Layivard, Sandra, MD  Levonorgestrel-Ethinyl Estradiol (SEASONIQUE) 0.15-0.03 &0.01 MG tablet Take 1 tablet by mouth daily.    [provider]  MAGNESIUM PO Take 1,000 mg by mouth daily.    [provider]  Multiple Vitamin (MULTIVITAMIN) tablet Take 1 tablet by mouth daily.    [provider]  nitrofurantoin (MACRODANTIN) 50 MG capsule Take one tablet po hs PRN. 11/04/11   Silverio Layivard, Sandra, MD  Riboflavin (B2 PO) Take 500 mg by mouth daily.    [provider]  SUMAtriptan (IMITREX) 25 MG tablet Take 25 mg by mouth every 2 (two) hours as needed.    [provider]  topiramate (TOPAMAX) 200 MG tablet Take 200 mg by mouth daily.    [provider]    Family History Family History  Problem Relation Age of Onset  . Thyroid cancer Father   . Breast cancer Maternal Aunt   . Colon cancer Maternal Grandmother     Social History Social History   Tobacco Use  . Smoking status: Former Smoker    Last attempt to quit: 07/06/2002    Years since quitting: 15.0  . Smokeless tobacco: Never Used  Substance Use Topics  . Alcohol use: No  . Drug use: No     Allergies   Patient has no known allergies.   Review  of Systems Review of Systems  Constitutional: Negative for chills and fever.  Respiratory: Negative for shortness of breath.   Cardiovascular: Negative for chest pain.  Neurological: Negative for weakness and numbness.       Negative for paresthesias     Physical Exam Updated Vital Signs BP 106/77 (BP Location: Left Arm)   Pulse 83   Temp 98.9 F (37.2 C) (Oral)   Resp 16   Ht 5\' 4"  (1.626 m)   Wt 63.5 kg (140 lb)   LMP 04/15/2017   SpO2 100%   BMI 24.03 kg/m   Physical Exam  Constitutional: She appears well-developed and well-nourished. No distress.  HENT:  Head:  Normocephalic and atraumatic.  Eyes: Conjunctivae are normal. Right eye exhibits no discharge. Left eye exhibits no discharge.  Cardiovascular: Normal rate and regular rhythm.  Pulmonary/Chest: Effort normal and breath sounds normal.  Neurological: She is alert.  Clear speech.   Psychiatric: She has a normal mood and affect. Her behavior is normal. Thought content normal.  Nursing note and vitals reviewed.    ED Treatments / Results  Labs (all labs ordered are listed, but only abnormal results are displayed) Labs Reviewed - No data to display  EKG None  Radiology No results found.  Procedures Procedures (including critical care time)  Medications Ordered in ED Medications  rabies immune globulin (HYPERAB/KEDRAB) injection 20 Units/kg (has no administration in time range)  rabies vaccine (RABAVERT) injection 1 mL (has no administration in time range)  Tdap (BOOSTRIX) injection 0.5 mL (has no administration in time range)     Initial Impression / Assessment and Plan / ED Course  I have reviewed the triage vital signs and the nursing notes.  Pertinent labs & imaging results that were available during my care of the patient were reviewed by me and considered in my medical decision making (see chart for details).   Patient presents a the request of the health department for initiation of rabies vaccination series. Patient has not been previously vaccinated. She has no complaints at this time. Will initiate series and provide schedule and urgent care information for subsequent vaccine administration to patient. I discussed treatment plan, need for follow up for series completion, and return precautions with the patient. Provided opportunity for questions, patient confirmed understanding and is in agreement with plan.     Final Clinical Impressions(s) / ED Diagnoses   Final diagnoses:  Need for rabies vaccination    ED Discharge Orders    None       Desmond Lope 07/14/17 1940    Mesner, Barbara Cower, MD 07/14/17 2144

## 2017-07-14 NOTE — Discharge Instructions (Addendum)
You were seen in the ER to start the rabies vaccine. We have given you the first vaccination of the series in the ER today. We have given you a hand out for information regarding subsequent vaccines- please follow this schedule and return to the ER or go to the urgent care listed on the sheet for these vaccines.   Return to the ER for any new or worsening symptoms or any other concerns.

## 2017-07-17 ENCOUNTER — Ambulatory Visit (HOSPITAL_COMMUNITY)
Admission: EM | Admit: 2017-07-17 | Discharge: 2017-07-17 | Disposition: A | Discharge status: Home / Self Care | Payer: Managed Care, Other (non HMO) | Attending: Family Medicine | Admitting: Family Medicine

## 2017-07-17 DIAGNOSIS — Z203 Contact with and (suspected) exposure to rabies: Secondary | ICD-10-CM

## 2017-07-17 MED ORDER — RABIES VACCINE, PCEC IM SUSR
INTRAMUSCULAR | Status: AC
  Filled 2017-07-17: qty 1

## 2017-07-17 MED ORDER — RABIES VACCINE, PCEC IM SUSR
1.0000 mL | Freq: Once | INTRAMUSCULAR | Status: AC
  Administered 2017-07-17: 1 mL via INTRAMUSCULAR

## 2017-07-17 NOTE — ED Triage Notes (Signed)
Rabies vaccine.

## 2017-07-17 NOTE — ED Notes (Signed)
Bed: UC01 Expected date:  Expected time:  Means of arrival:  Comments: Hold for appts

## 2017-07-21 ENCOUNTER — Encounter (HOSPITAL_COMMUNITY): Payer: Self-pay | Admitting: Emergency Medicine

## 2017-07-21 ENCOUNTER — Ambulatory Visit (HOSPITAL_COMMUNITY)
Admission: EM | Admit: 2017-07-21 | Discharge: 2017-07-21 | Disposition: A | Discharge status: Home / Self Care | Payer: Managed Care, Other (non HMO) | Attending: Internal Medicine | Admitting: Internal Medicine

## 2017-07-21 DIAGNOSIS — W5559XA Other contact with raccoon, initial encounter: Secondary | ICD-10-CM

## 2017-07-21 DIAGNOSIS — Z203 Contact with and (suspected) exposure to rabies: Secondary | ICD-10-CM | POA: Diagnosis not present

## 2017-07-21 DIAGNOSIS — Z23 Encounter for immunization: Secondary | ICD-10-CM | POA: Diagnosis not present

## 2017-07-21 MED ORDER — RABIES VACCINE, PCEC IM SUSR
INTRAMUSCULAR | Status: AC
  Filled 2017-07-21: qty 1

## 2017-07-21 MED ORDER — RABIES VACCINE, PCEC IM SUSR
1.0000 mL | Freq: Once | INTRAMUSCULAR | Status: AC
  Administered 2017-07-21: 1 mL via INTRAMUSCULAR

## 2017-07-21 NOTE — ED Triage Notes (Signed)
Pt here for 3rd rabies injection 

## 2017-07-21 NOTE — ED Notes (Signed)
Bed: UC01 Expected date:  Expected time:  Means of arrival:  Comments: For appts 

## 2017-07-28 ENCOUNTER — Ambulatory Visit (HOSPITAL_COMMUNITY)
Admission: EM | Admit: 2017-07-28 | Discharge: 2017-07-28 | Disposition: A | Discharge status: Home / Self Care | Payer: Managed Care, Other (non HMO) | Attending: Internal Medicine | Admitting: Internal Medicine

## 2017-07-28 ENCOUNTER — Other Ambulatory Visit: Payer: Self-pay

## 2017-07-28 ENCOUNTER — Encounter (HOSPITAL_COMMUNITY): Payer: Self-pay | Admitting: Emergency Medicine

## 2017-07-28 DIAGNOSIS — Z23 Encounter for immunization: Secondary | ICD-10-CM | POA: Diagnosis not present

## 2017-07-28 DIAGNOSIS — W5559XD Other contact with raccoon, subsequent encounter: Secondary | ICD-10-CM

## 2017-07-28 DIAGNOSIS — Z203 Contact with and (suspected) exposure to rabies: Secondary | ICD-10-CM

## 2017-07-28 MED ORDER — RABIES VACCINE, PCEC IM SUSR
INTRAMUSCULAR | Status: AC
  Filled 2017-07-28: qty 1

## 2017-07-28 MED ORDER — RABIES VACCINE, PCEC IM SUSR
1.0000 mL | Freq: Once | INTRAMUSCULAR | Status: AC
  Administered 2017-07-28: 1 mL via INTRAMUSCULAR

## 2017-07-28 NOTE — ED Triage Notes (Signed)
Patient is here for rabies injection 

## 2020-10-01 ENCOUNTER — Ambulatory Visit
Admission: RE | Admit: 2020-10-01 | Discharge: 2020-10-01 | Disposition: A | Discharge status: Home / Self Care | Payer: Managed Care, Other (non HMO) | Source: Ambulatory Visit | Attending: Obstetrics and Gynecology | Admitting: Obstetrics and Gynecology

## 2020-10-01 ENCOUNTER — Other Ambulatory Visit: Payer: Self-pay

## 2020-10-01 ENCOUNTER — Other Ambulatory Visit: Payer: Self-pay | Admitting: Obstetrics and Gynecology

## 2020-10-01 DIAGNOSIS — Z30431 Encounter for routine checking of intrauterine contraceptive device: Secondary | ICD-10-CM

## 2021-10-19 IMAGING — CR DG ABDOMEN 1V
1 series · 1 of 1 positions shown · non-contrast
Comparison: CT abdomen/pelvis 06/16/2011

CLINICAL DATA: Check to see if IUD in place

EXAM:
ABDOMEN - 1 VIEW

[t abdomen supine]
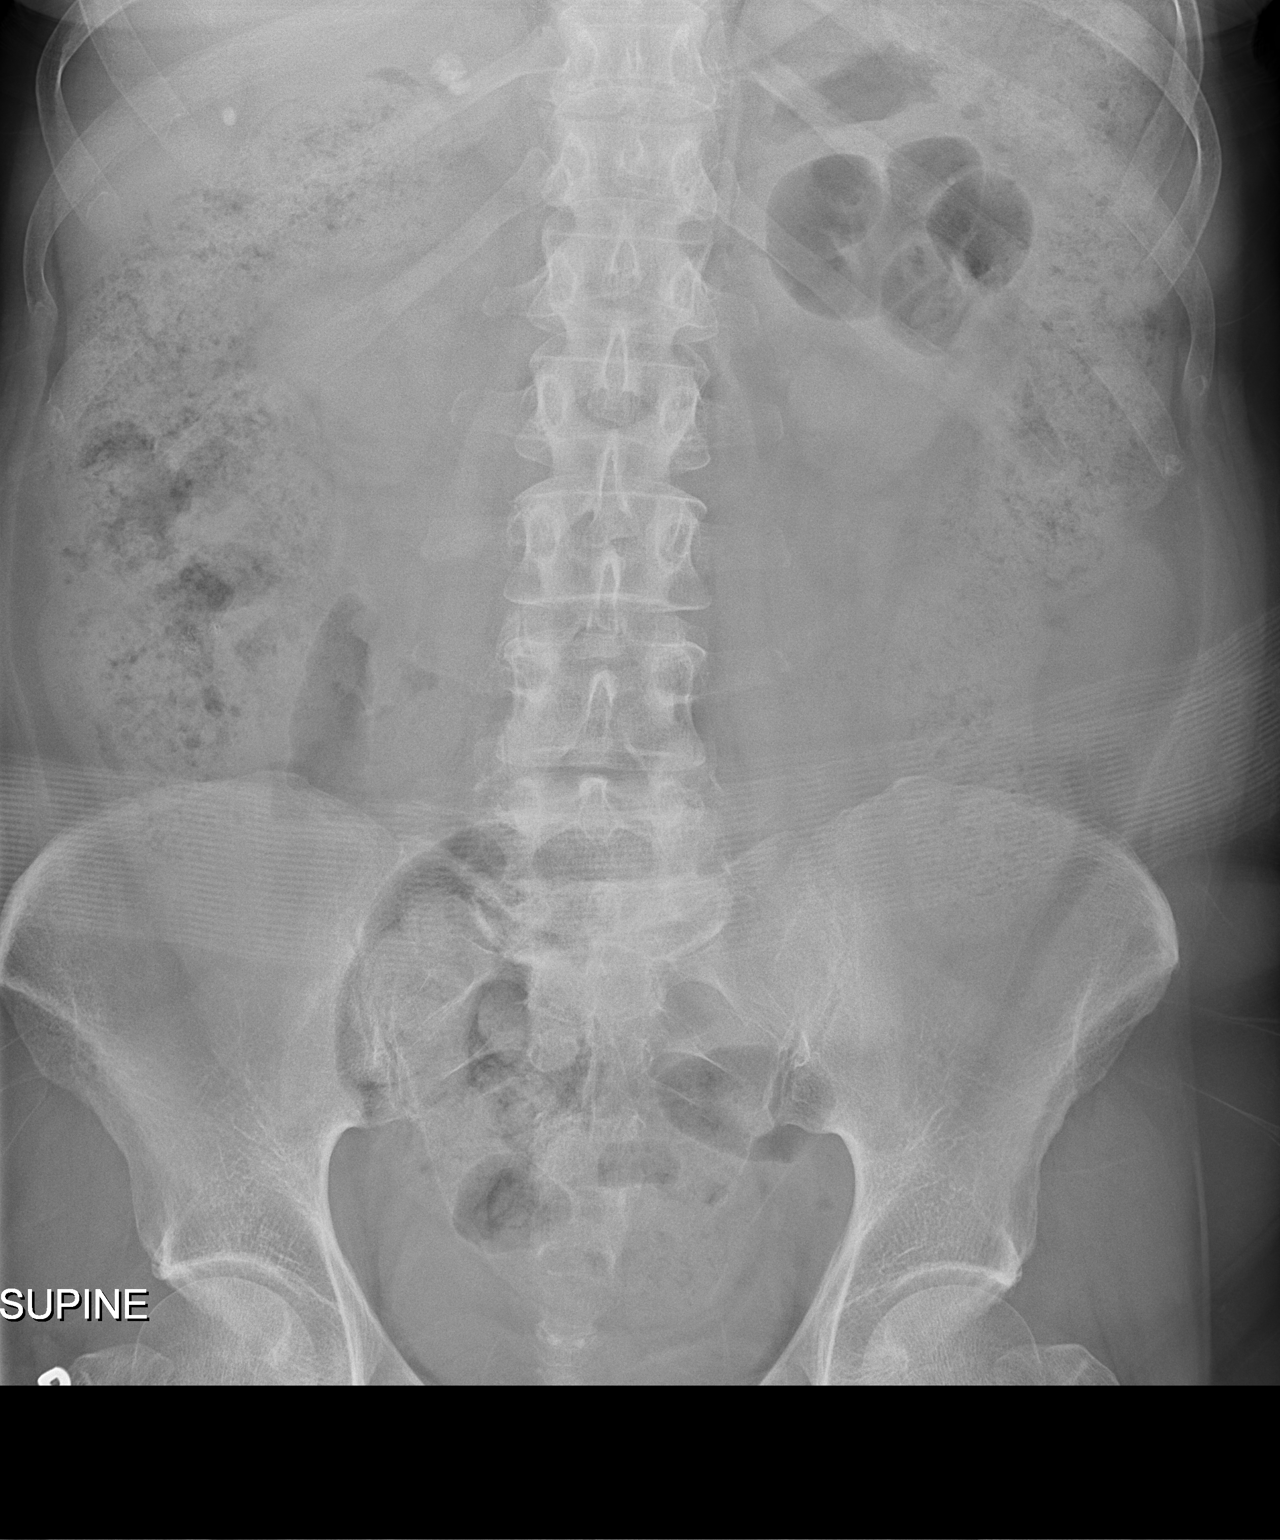

[1 of 1 positions shown; findings below may reference images not displayed]

FINDINGS: No IUD is identified.

There is a nonobstructive bowel gas pattern. There is a moderate
colonic stool burden. There is no gross organomegaly. Calcifications
projecting over the right upper quadrant may reflect gallstones. The
bones are unremarkable.
IMPRESSION: No IUD identified.

## 2022-10-08 ENCOUNTER — Other Ambulatory Visit: Payer: Self-pay | Admitting: Family Medicine

## 2022-10-08 DIAGNOSIS — R053 Chronic cough: Secondary | ICD-10-CM

## 2022-10-29 ENCOUNTER — Ambulatory Visit
Admission: RE | Admit: 2022-10-29 | Discharge: 2022-10-29 | Disposition: A | Discharge status: Home / Self Care | Payer: Managed Care, Other (non HMO) | Source: Ambulatory Visit | Attending: Family Medicine | Admitting: Family Medicine

## 2022-10-29 DIAGNOSIS — R053 Chronic cough: Secondary | ICD-10-CM

## 2023-02-11 DIAGNOSIS — Z713 Dietary counseling and surveillance: Secondary | ICD-10-CM | POA: Diagnosis not present

## 2023-02-12 DIAGNOSIS — D225 Melanocytic nevi of trunk: Secondary | ICD-10-CM | POA: Diagnosis not present

## 2023-02-12 DIAGNOSIS — L821 Other seborrheic keratosis: Secondary | ICD-10-CM | POA: Diagnosis not present

## 2023-02-12 DIAGNOSIS — L578 Other skin changes due to chronic exposure to nonionizing radiation: Secondary | ICD-10-CM | POA: Diagnosis not present

## 2023-02-12 DIAGNOSIS — L570 Actinic keratosis: Secondary | ICD-10-CM | POA: Diagnosis not present

## 2023-02-18 DIAGNOSIS — Z713 Dietary counseling and surveillance: Secondary | ICD-10-CM | POA: Diagnosis not present

## 2023-03-18 DIAGNOSIS — Z713 Dietary counseling and surveillance: Secondary | ICD-10-CM | POA: Diagnosis not present

## 2023-04-01 DIAGNOSIS — Z0000 Encounter for general adult medical examination without abnormal findings: Secondary | ICD-10-CM | POA: Diagnosis not present

## 2023-04-01 DIAGNOSIS — E785 Hyperlipidemia, unspecified: Secondary | ICD-10-CM | POA: Diagnosis not present

## 2023-04-01 DIAGNOSIS — E559 Vitamin D deficiency, unspecified: Secondary | ICD-10-CM | POA: Diagnosis not present

## 2023-04-07 DIAGNOSIS — G43719 Chronic migraine without aura, intractable, without status migrainosus: Secondary | ICD-10-CM | POA: Diagnosis not present

## 2023-04-08 DIAGNOSIS — Z713 Dietary counseling and surveillance: Secondary | ICD-10-CM | POA: Diagnosis not present

## 2023-04-22 DIAGNOSIS — Z713 Dietary counseling and surveillance: Secondary | ICD-10-CM | POA: Diagnosis not present

## 2023-05-13 DIAGNOSIS — Z713 Dietary counseling and surveillance: Secondary | ICD-10-CM | POA: Diagnosis not present

## 2023-06-03 DIAGNOSIS — F411 Generalized anxiety disorder: Secondary | ICD-10-CM | POA: Diagnosis not present

## 2023-06-10 DIAGNOSIS — Z713 Dietary counseling and surveillance: Secondary | ICD-10-CM | POA: Diagnosis not present

## 2023-07-20 DIAGNOSIS — N390 Urinary tract infection, site not specified: Secondary | ICD-10-CM | POA: Diagnosis not present

## 2023-07-20 DIAGNOSIS — Z780 Asymptomatic menopausal state: Secondary | ICD-10-CM | POA: Diagnosis not present

## 2023-07-20 DIAGNOSIS — N281 Cyst of kidney, acquired: Secondary | ICD-10-CM | POA: Diagnosis not present

## 2023-08-10 ENCOUNTER — Other Ambulatory Visit: Payer: Self-pay | Admitting: Obstetrics and Gynecology

## 2023-08-10 DIAGNOSIS — Z1231 Encounter for screening mammogram for malignant neoplasm of breast: Secondary | ICD-10-CM

## 2023-10-07 DIAGNOSIS — G43719 Chronic migraine without aura, intractable, without status migrainosus: Secondary | ICD-10-CM | POA: Diagnosis not present

## 2023-10-18 ENCOUNTER — Ambulatory Visit
Admission: RE | Admit: 2023-10-18 | Discharge: 2023-10-18 | Disposition: A | Discharge status: Home / Self Care | Source: Ambulatory Visit | Attending: Obstetrics and Gynecology | Admitting: Obstetrics and Gynecology

## 2023-10-18 DIAGNOSIS — Z1231 Encounter for screening mammogram for malignant neoplasm of breast: Secondary | ICD-10-CM | POA: Diagnosis not present

## 2023-10-18 DIAGNOSIS — Z01419 Encounter for gynecological examination (general) (routine) without abnormal findings: Secondary | ICD-10-CM | POA: Diagnosis not present

## 2023-10-18 DIAGNOSIS — Z1330 Encounter for screening examination for mental health and behavioral disorders, unspecified: Secondary | ICD-10-CM | POA: Diagnosis not present
